# Patient Record
Sex: Female | Born: 2000 | Race: White | Hispanic: No | Marital: Single | Smoking: Current some day smoker
Health system: Southern US, Community
[De-identification: ages and names within clinical notes are randomized; demographics above are authoritative.]

## PROBLEM LIST (undated history)

## (undated) DIAGNOSIS — F32A Depression, unspecified: Secondary | ICD-10-CM

## (undated) DIAGNOSIS — F419 Anxiety disorder, unspecified: Secondary | ICD-10-CM

## (undated) DIAGNOSIS — J45909 Unspecified asthma, uncomplicated: Secondary | ICD-10-CM

## (undated) DIAGNOSIS — F1910 Other psychoactive substance abuse, uncomplicated: Secondary | ICD-10-CM

## (undated) DIAGNOSIS — F329 Major depressive disorder, single episode, unspecified: Secondary | ICD-10-CM

---
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
## 2020-09-09 DIAGNOSIS — Z3046 Encounter for surveillance of implantable subdermal contraceptive: Secondary | ICD-10-CM | POA: Diagnosis not present

## 2020-12-08 DIAGNOSIS — N912 Amenorrhea, unspecified: Secondary | ICD-10-CM | POA: Diagnosis not present

## 2020-12-08 DIAGNOSIS — Z6827 Body mass index (BMI) 27.0-27.9, adult: Secondary | ICD-10-CM | POA: Diagnosis not present

## 2020-12-08 DIAGNOSIS — Z01419 Encounter for gynecological examination (general) (routine) without abnormal findings: Secondary | ICD-10-CM | POA: Diagnosis not present

## 2021-04-02 DIAGNOSIS — F319 Bipolar disorder, unspecified: Secondary | ICD-10-CM | POA: Diagnosis not present

## 2021-04-02 DIAGNOSIS — F411 Generalized anxiety disorder: Secondary | ICD-10-CM | POA: Diagnosis not present

## 2021-04-08 DIAGNOSIS — F411 Generalized anxiety disorder: Secondary | ICD-10-CM | POA: Diagnosis not present

## 2021-04-08 DIAGNOSIS — F319 Bipolar disorder, unspecified: Secondary | ICD-10-CM | POA: Diagnosis not present

## 2021-04-15 DIAGNOSIS — F319 Bipolar disorder, unspecified: Secondary | ICD-10-CM | POA: Diagnosis not present

## 2021-04-15 DIAGNOSIS — F411 Generalized anxiety disorder: Secondary | ICD-10-CM | POA: Diagnosis not present

## 2021-04-27 DIAGNOSIS — F319 Bipolar disorder, unspecified: Secondary | ICD-10-CM | POA: Diagnosis not present

## 2021-04-27 DIAGNOSIS — F411 Generalized anxiety disorder: Secondary | ICD-10-CM | POA: Diagnosis not present

## 2021-05-04 DIAGNOSIS — F319 Bipolar disorder, unspecified: Secondary | ICD-10-CM | POA: Diagnosis not present

## 2021-05-04 DIAGNOSIS — F411 Generalized anxiety disorder: Secondary | ICD-10-CM | POA: Diagnosis not present

## 2021-05-11 DIAGNOSIS — F319 Bipolar disorder, unspecified: Secondary | ICD-10-CM | POA: Diagnosis not present

## 2021-05-11 DIAGNOSIS — F411 Generalized anxiety disorder: Secondary | ICD-10-CM | POA: Diagnosis not present

## 2021-05-25 DIAGNOSIS — F411 Generalized anxiety disorder: Secondary | ICD-10-CM | POA: Diagnosis not present

## 2021-05-25 DIAGNOSIS — F319 Bipolar disorder, unspecified: Secondary | ICD-10-CM | POA: Diagnosis not present

## 2021-05-26 DIAGNOSIS — J4520 Mild intermittent asthma, uncomplicated: Secondary | ICD-10-CM | POA: Diagnosis not present

## 2021-05-26 DIAGNOSIS — J3089 Other allergic rhinitis: Secondary | ICD-10-CM | POA: Diagnosis not present

## 2021-05-26 DIAGNOSIS — Z91018 Allergy to other foods: Secondary | ICD-10-CM | POA: Diagnosis not present

## 2021-05-26 DIAGNOSIS — J3081 Allergic rhinitis due to animal (cat) (dog) hair and dander: Secondary | ICD-10-CM | POA: Diagnosis not present

## 2021-05-26 DIAGNOSIS — J301 Allergic rhinitis due to pollen: Secondary | ICD-10-CM | POA: Diagnosis not present

## 2021-05-26 DIAGNOSIS — Z91040 Latex allergy status: Secondary | ICD-10-CM | POA: Diagnosis not present

## 2021-06-15 DIAGNOSIS — J3089 Other allergic rhinitis: Secondary | ICD-10-CM | POA: Diagnosis not present

## 2021-06-15 DIAGNOSIS — J3081 Allergic rhinitis due to animal (cat) (dog) hair and dander: Secondary | ICD-10-CM | POA: Diagnosis not present

## 2021-06-15 DIAGNOSIS — F411 Generalized anxiety disorder: Secondary | ICD-10-CM | POA: Diagnosis not present

## 2021-06-15 DIAGNOSIS — F319 Bipolar disorder, unspecified: Secondary | ICD-10-CM | POA: Diagnosis not present

## 2021-06-15 DIAGNOSIS — J301 Allergic rhinitis due to pollen: Secondary | ICD-10-CM | POA: Diagnosis not present

## 2021-06-22 DIAGNOSIS — F319 Bipolar disorder, unspecified: Secondary | ICD-10-CM | POA: Diagnosis not present

## 2021-06-22 DIAGNOSIS — F411 Generalized anxiety disorder: Secondary | ICD-10-CM | POA: Diagnosis not present

## 2021-06-29 DIAGNOSIS — F411 Generalized anxiety disorder: Secondary | ICD-10-CM | POA: Diagnosis not present

## 2021-06-29 DIAGNOSIS — F319 Bipolar disorder, unspecified: Secondary | ICD-10-CM | POA: Diagnosis not present

## 2021-11-04 DIAGNOSIS — B3731 Acute candidiasis of vulva and vagina: Secondary | ICD-10-CM | POA: Diagnosis not present

## 2021-11-04 DIAGNOSIS — Z113 Encounter for screening for infections with a predominantly sexual mode of transmission: Secondary | ICD-10-CM | POA: Diagnosis not present

## 2021-11-04 DIAGNOSIS — N912 Amenorrhea, unspecified: Secondary | ICD-10-CM | POA: Diagnosis not present

## 2022-03-25 DIAGNOSIS — F3181 Bipolar II disorder: Secondary | ICD-10-CM | POA: Diagnosis not present

## 2022-04-05 DIAGNOSIS — F3181 Bipolar II disorder: Secondary | ICD-10-CM | POA: Diagnosis not present

## 2022-04-21 DIAGNOSIS — J069 Acute upper respiratory infection, unspecified: Secondary | ICD-10-CM | POA: Diagnosis not present

## 2022-04-21 DIAGNOSIS — Z6829 Body mass index (BMI) 29.0-29.9, adult: Secondary | ICD-10-CM | POA: Diagnosis not present

## 2022-04-21 DIAGNOSIS — M791 Myalgia, unspecified site: Secondary | ICD-10-CM | POA: Diagnosis not present

## 2022-04-21 DIAGNOSIS — J029 Acute pharyngitis, unspecified: Secondary | ICD-10-CM | POA: Diagnosis not present

## 2022-05-28 DIAGNOSIS — F1490 Cocaine use, unspecified, uncomplicated: Secondary | ICD-10-CM | POA: Diagnosis not present

## 2022-05-28 DIAGNOSIS — F419 Anxiety disorder, unspecified: Secondary | ICD-10-CM | POA: Diagnosis not present

## 2022-05-28 DIAGNOSIS — N390 Urinary tract infection, site not specified: Secondary | ICD-10-CM | POA: Diagnosis not present

## 2022-05-28 DIAGNOSIS — R45851 Suicidal ideations: Secondary | ICD-10-CM | POA: Diagnosis not present

## 2022-05-28 DIAGNOSIS — Z0489 Encounter for examination and observation for other specified reasons: Secondary | ICD-10-CM | POA: Diagnosis not present

## 2022-05-28 DIAGNOSIS — F3189 Other bipolar disorder: Secondary | ICD-10-CM | POA: Diagnosis not present

## 2022-05-28 DIAGNOSIS — F1210 Cannabis abuse, uncomplicated: Secondary | ICD-10-CM | POA: Diagnosis not present

## 2022-05-29 DIAGNOSIS — F4310 Post-traumatic stress disorder, unspecified: Secondary | ICD-10-CM | POA: Diagnosis not present

## 2022-05-29 DIAGNOSIS — D509 Iron deficiency anemia, unspecified: Secondary | ICD-10-CM | POA: Diagnosis not present

## 2022-05-29 DIAGNOSIS — R45851 Suicidal ideations: Secondary | ICD-10-CM | POA: Diagnosis not present

## 2022-05-29 DIAGNOSIS — F6089 Other specific personality disorders: Secondary | ICD-10-CM | POA: Diagnosis not present

## 2022-05-29 DIAGNOSIS — G4700 Insomnia, unspecified: Secondary | ICD-10-CM | POA: Diagnosis not present

## 2022-05-29 DIAGNOSIS — F1221 Cannabis dependence, in remission: Secondary | ICD-10-CM | POA: Diagnosis not present

## 2022-05-29 DIAGNOSIS — Z813 Family history of other psychoactive substance abuse and dependence: Secondary | ICD-10-CM | POA: Diagnosis not present

## 2022-05-29 DIAGNOSIS — Z9151 Personal history of suicidal behavior: Secondary | ICD-10-CM | POA: Diagnosis not present

## 2022-05-29 DIAGNOSIS — Z818 Family history of other mental and behavioral disorders: Secondary | ICD-10-CM | POA: Diagnosis not present

## 2022-05-29 DIAGNOSIS — Z811 Family history of alcohol abuse and dependence: Secondary | ICD-10-CM | POA: Diagnosis not present

## 2022-05-29 DIAGNOSIS — F1021 Alcohol dependence, in remission: Secondary | ICD-10-CM | POA: Diagnosis not present

## 2022-05-29 DIAGNOSIS — F17290 Nicotine dependence, other tobacco product, uncomplicated: Secondary | ICD-10-CM | POA: Diagnosis not present

## 2022-05-29 DIAGNOSIS — F419 Anxiety disorder, unspecified: Secondary | ICD-10-CM | POA: Diagnosis not present

## 2022-05-29 DIAGNOSIS — F3163 Bipolar disorder, current episode mixed, severe, without psychotic features: Secondary | ICD-10-CM | POA: Diagnosis not present

## 2022-05-29 DIAGNOSIS — J45909 Unspecified asthma, uncomplicated: Secondary | ICD-10-CM | POA: Diagnosis not present

## 2022-05-29 DIAGNOSIS — F1420 Cocaine dependence, uncomplicated: Secondary | ICD-10-CM | POA: Diagnosis not present

## 2022-06-10 DIAGNOSIS — F331 Major depressive disorder, recurrent, moderate: Secondary | ICD-10-CM | POA: Diagnosis not present

## 2022-06-17 DIAGNOSIS — F331 Major depressive disorder, recurrent, moderate: Secondary | ICD-10-CM | POA: Diagnosis not present

## 2022-06-17 DIAGNOSIS — N644 Mastodynia: Secondary | ICD-10-CM | POA: Diagnosis not present

## 2022-06-20 DIAGNOSIS — F331 Major depressive disorder, recurrent, moderate: Secondary | ICD-10-CM | POA: Diagnosis not present

## 2022-06-22 DIAGNOSIS — R928 Other abnormal and inconclusive findings on diagnostic imaging of breast: Secondary | ICD-10-CM | POA: Diagnosis not present

## 2022-06-22 DIAGNOSIS — N6320 Unspecified lump in the left breast, unspecified quadrant: Secondary | ICD-10-CM | POA: Diagnosis not present

## 2022-06-29 DIAGNOSIS — F1221 Cannabis dependence, in remission: Secondary | ICD-10-CM | POA: Diagnosis not present

## 2022-06-29 DIAGNOSIS — F439 Reaction to severe stress, unspecified: Secondary | ICD-10-CM | POA: Diagnosis not present

## 2022-06-29 DIAGNOSIS — F1421 Cocaine dependence, in remission: Secondary | ICD-10-CM | POA: Diagnosis not present

## 2022-06-29 DIAGNOSIS — F331 Major depressive disorder, recurrent, moderate: Secondary | ICD-10-CM | POA: Diagnosis not present

## 2022-06-30 DIAGNOSIS — F331 Major depressive disorder, recurrent, moderate: Secondary | ICD-10-CM | POA: Diagnosis not present

## 2022-07-06 DIAGNOSIS — F331 Major depressive disorder, recurrent, moderate: Secondary | ICD-10-CM | POA: Diagnosis not present

## 2022-09-07 DIAGNOSIS — F331 Major depressive disorder, recurrent, moderate: Secondary | ICD-10-CM | POA: Diagnosis not present

## 2022-09-26 DIAGNOSIS — F1421 Cocaine dependence, in remission: Secondary | ICD-10-CM | POA: Diagnosis not present

## 2022-09-26 DIAGNOSIS — F1221 Cannabis dependence, in remission: Secondary | ICD-10-CM | POA: Diagnosis not present

## 2022-09-26 DIAGNOSIS — F331 Major depressive disorder, recurrent, moderate: Secondary | ICD-10-CM | POA: Diagnosis not present

## 2022-09-26 DIAGNOSIS — F439 Reaction to severe stress, unspecified: Secondary | ICD-10-CM | POA: Diagnosis not present

## 2022-09-28 DIAGNOSIS — F331 Major depressive disorder, recurrent, moderate: Secondary | ICD-10-CM | POA: Diagnosis not present

## 2022-10-17 DIAGNOSIS — F331 Major depressive disorder, recurrent, moderate: Secondary | ICD-10-CM | POA: Diagnosis not present
